# Patient Record
Sex: Male | Born: 1985 | Race: Black or African American | Hispanic: No | Marital: Single | Smoking: Former smoker
Health system: Southern US, Community
[De-identification: ages and names within clinical notes are randomized; demographics above are authoritative.]

---

## 2015-05-19 ENCOUNTER — Emergency Department (HOSPITAL_COMMUNITY)
Admission: EM | Admit: 2015-05-19 | Discharge: 2015-05-20 | Disposition: A | Discharge status: Home / Self Care | Payer: BLUE CROSS/BLUE SHIELD | Attending: Emergency Medicine | Admitting: Emergency Medicine

## 2015-05-19 ENCOUNTER — Encounter (HOSPITAL_COMMUNITY): Payer: Self-pay | Admitting: Oncology

## 2015-05-19 ENCOUNTER — Emergency Department (HOSPITAL_COMMUNITY): Payer: BLUE CROSS/BLUE SHIELD

## 2015-05-19 DIAGNOSIS — Z720 Tobacco use: Secondary | ICD-10-CM | POA: Diagnosis not present

## 2015-05-19 DIAGNOSIS — N4889 Other specified disorders of penis: Secondary | ICD-10-CM | POA: Diagnosis present

## 2015-05-19 DIAGNOSIS — N5082 Scrotal pain: Secondary | ICD-10-CM

## 2015-05-19 DIAGNOSIS — I861 Scrotal varices: Secondary | ICD-10-CM | POA: Insufficient documentation

## 2015-05-19 LAB — URINALYSIS, ROUTINE W REFLEX MICROSCOPIC
Bilirubin Urine: NEGATIVE
GLUCOSE, UA: NEGATIVE mg/dL
Hgb urine dipstick: NEGATIVE
KETONES UR: NEGATIVE mg/dL
Nitrite: NEGATIVE
PROTEIN: NEGATIVE mg/dL
Specific Gravity, Urine: 1.019 (ref 1.005–1.030)
Urobilinogen, UA: 0.2 mg/dL (ref 0.0–1.0)
pH: 5.5 (ref 5.0–8.0)

## 2015-05-19 LAB — URINE MICROSCOPIC-ADD ON

## 2015-05-19 MED ORDER — IBUPROFEN 800 MG PO TABS: 800.0000 mg | ORAL_TABLET | Freq: Three times a day (TID) | ORAL | Status: DC

## 2015-05-19 NOTE — Discharge Instructions (Signed)
Varicocele A varicocele is a swelling of veins in the scrotum (the bag of skin that contains the testicles). It is most common in young men. It occurs most often on the left side. Small or painless varicoceles do not need treatment. Most often, this is not a serious problem, but further tests may be needed to confirm the diagnosis. Surgery may be needed if complications of varicoceles arise. Rarely, varicoceles can reoccur after surgery. CAUSES  The swelling is due to blood backing up in the vein that leads from the testicle back to the body. Blood backs up because the valves inside the vein are not working properly. Veins normally return blood to the heart. Valves in veins are supposed to be one-way valves. They should not allow blood to flow backwards. If the valves do not work well, blood can pool in a vein and make it swell. The same thing happens with varicose veins in the leg. SYMPTOMS  A varicocele most often causes no symptoms. When they occur, symptoms include:   Swelling on one side of the scrotum.  Swelling that is more obvious when standing up.  A lumpy feeling in the scrotum.  Heaviness on one side of the scrotum.  Dull ache in the scrotum, especially after exercise or prolonged standing or sitting.  Slower growth or reduced size of the testicle on the side of the varicocele (in young males).  Problems with fertility can arise if the testicle does not grow normally. DIAGNOSIS  Varicocele is usually diagnosed by a physical exam. Sometimes ultrasonography is done. TREATMENT  Usually, varicoceles need no treatment. They are often routinely monitored on exam by your caregiver to ensure they do not slow the growth of the testicle on that side. Treatment may be needed if:  The varicocele is large.  There is a lot of pain.  The varicocele causes a decrease in the size of the testicle in a growing adolescent.  The other testicle is absent or not normal.  Varicoceles are found on  both sides of the scrotum.  There is pain when exercising.  There are fertility problems. There are two types of treatment:  Surgery. The surgeon ties off the swollen veins. Surgery may be done with an incision in the skin or through a laparoscope. The surgery is usually done in an outpatient setting. Outpatient means there is no overnight stay in a hospital.  Embolization. A small tube is placed in a vein and guided into the swollen veins. X-rays are used to guide the small tube. Tiny metal coils or other blocking items are put through the tube. This blocks swollen veins and the flow of blood. This is usually done in an outpatient setting without the use of general anesthesia. HOME CARE INSTRUCTIONS  To decrease discomfort:  Wear supportive underwear.  Use an athletic supporter for sports.  Only take over-the-counter or prescription medicines for pain or discomfort as directed by your caregiver. SEEK MEDICAL CARE IF:   Pain is increasing.  Swelling does not decrease when lying down.  Testicle is smaller.  The testicle becomes enlarged, swollen, red, or painful. Document Released: 11/09/2000 Document Revised: 10/26/2011 Document Reviewed: 11/13/2009 ExitCare Patient Information 2015 [employer]. This information is not intended to replace advice given to you by your health care provider. Make sure you discuss any questions you have with your health care provider.  

## 2015-05-19 NOTE — ED Notes (Signed)
Pt presents d/t penis pain and swelling.  This has happened in the past however was never seen for it.

## 2015-05-20 NOTE — ED Provider Notes (Signed)
CSN: [id number]     Arrival date & time 05/19/15  2023 History   First MD Initiated Contact with Patient 05/19/15 2129     Chief Complaint  Patient presents with  . Penis Pain     (Consider location/radiation/quality/duration/timing/severity/associated sxs/prior Treatment) HPI Comments: Patient had testicular pain and swelling when he sat down today. He did not injure it. He has felt this pain recurrently over the past 18 months. Patient has not associated this with heavy lifting. The pain has improved but is still present. He had some mild swelling behind the testicle but no actual testicular pain. No dysuria or penile discharge.  Patient is a 29 y.o. male presenting with penile pain. The history is provided by the patient.  Penis Pain This is a recurrent problem. The current episode started 3 to 5 hours ago. The problem occurs constantly. The problem has been gradually improving. Pertinent negatives include no abdominal pain and no shortness of breath. Nothing aggravates the symptoms. Nothing relieves the symptoms.    History reviewed. No pertinent past medical history. History reviewed. No pertinent past surgical history. History reviewed. No pertinent family history. Social History  Substance Use Topics  . Smoking status: Current Every Day Smoker  . Smokeless tobacco: Never Used  . Alcohol Use: Yes    Review of Systems  Constitutional: Negative for fever.  Respiratory: Negative for cough and shortness of breath.   Gastrointestinal: Negative for vomiting and abdominal pain.  Genitourinary: Positive for penile pain.  All other systems reviewed and are negative.     Allergies  Review of patient's allergies indicates no known allergies.  Home Medications   Prior to Admission medications   Medication Sig Start Date End Date Taking? Authorizing Provider  ibuprofen (ADVIL,MOTRIN) 800 MG tablet Take 1 tablet (800 mg total) by mouth 3 (three) times daily. 05/19/15   Elwin Mocha, MD   BP 121/78 mmHg  Pulse 74  Temp(Src) 98 F (36.7 C) (Oral)  Resp 18  Ht  (1.778 m)  Wt 155 lb (70.308 kg)  BMI 22.24 kg/m2  SpO2 98% Physical Exam  Constitutional: He is oriented to person, place, and time. He appears well-developed and well-nourished. No distress.  HENT:  Head: Normocephalic and atraumatic.  Mouth/Throat: No oropharyngeal exudate.  Eyes: EOM are normal. Pupils are equal, round, and reactive to light.  Neck: Normal range of motion. Neck supple.  Cardiovascular: Normal rate and regular rhythm.  Exam reveals no friction rub.   No murmur heard. Pulmonary/Chest: Effort normal and breath sounds normal. No respiratory distress. He has no wheezes. He has no rales.  Abdominal: He exhibits no distension. There is no tenderness. There is no rebound.  Genitourinary: Right testis shows swelling (mild epididymal swelling). Right testis shows no mass and no tenderness. Right testis is descended. Left testis shows no mass, no swelling and no tenderness. Left testis is descended. No phimosis, hypospadias or penile tenderness.  Musculoskeletal: Normal range of motion. He exhibits no edema.  Neurological: He is alert and oriented to person, place, and time.  Skin: He is not diaphoretic.  Nursing note and vitals reviewed.   ED Course  Procedures (including critical care time) Labs Review Labs Reviewed  URINALYSIS, ROUTINE W REFLEX MICROSCOPIC (NOT AT [facility]) - Abnormal; Notable for the following:    APPearance CLOUDY (*)    Leukocytes, UA SMALL (*)    All other components within normal limits  URINE MICROSCOPIC-ADD ON    Imaging Review US Scrotum  05/19/2015   CLINICAL DATA:  Right epididymal pain.  Possible hernia.  EXAM: SCROTAL ULTRASOUND  DOPPLER ULTRASOUND OF THE TESTICLES  TECHNIQUE: Complete ultrasound examination of the testicles, epididymis, and other scrotal structures was performed. Color and spectral Doppler ultrasound were also utilized to evaluate  blood flow to the testicles.  COMPARISON:  None.  FINDINGS: Right testicle  Measurements: 4.4 x 1.7 x 2.3 cm. No mass or microlithiasis visualized.  Left testicle  Measurements: 4.4 x 1.6 x 2.4 cm. No mass or microlithiasis visualized.  Right epididymis:  Normal in size and appearance.  Left epididymis:  Normal in size and appearance.  Hydrocele:  None visualized.  Varicocele:  Small varicoceles bilaterally.  Pulsed Doppler interrogation of both testes demonstrates normal low resistance arterial and venous waveforms bilaterally.  IMPRESSION: Normal testis and epididymis bilaterally. No torsion. Small bilateral varicoceles.   Electronically Signed   By: Ellery Plunk M.D.   On: 05/19/2015 23:44   [place] Art/ven Flow Abd Pelv Doppler  05/19/2015   CLINICAL DATA:  Right epididymal pain.  Possible hernia.  EXAM: SCROTAL ULTRASOUND  DOPPLER ULTRASOUND OF THE TESTICLES  TECHNIQUE: Complete ultrasound examination of the testicles, epididymis, and other scrotal structures was performed. Color and spectral Doppler ultrasound were also utilized to evaluate blood flow to the testicles.  COMPARISON:  None.  FINDINGS: Right testicle  Measurements: 4.4 x 1.7 x 2.3 cm. No mass or microlithiasis visualized.  Left testicle  Measurements: 4.4 x 1.6 x 2.4 cm. No mass or microlithiasis visualized.  Right epididymis:  Normal in size and appearance.  Left epididymis:  Normal in size and appearance.  Hydrocele:  None visualized.  Varicocele:  Small varicoceles bilaterally.  Pulsed Doppler interrogation of both testes demonstrates normal low resistance arterial and venous waveforms bilaterally.  IMPRESSION: Normal testis and epididymis bilaterally. No torsion. Small bilateral varicoceles.   Electronically Signed   By: Ellery Plunk M.D.   On: 05/19/2015 23:44   I have personally reviewed and evaluated these images and lab results as part of my medical decision-making.   EKG Interpretation None      MDM   Final diagnoses:   Bilateral varicoceles    28 rolled male here with some mild scrotal pain and swelling in the epididymal tissues. No discharge. He states recent negative testing for gonorrhea and chlamydia. Here vitals are stable. Testicles are nontender. He does have some mild swelling in the epididymal tissue spine the right testicle with mild tenderness. No hernias appreciated ultrasound shows small bilateral varicoceles. Instructed to do supportive underwear, NSAIDs, and urology follow-up    Elwin Mocha, MD 05/20/15 0006

## 2015-11-04 ENCOUNTER — Emergency Department (HOSPITAL_COMMUNITY)
Admission: EM | Admit: 2015-11-04 | Discharge: 2015-11-04 | Disposition: A | Discharge status: Home / Self Care | Payer: BLUE CROSS/BLUE SHIELD | Attending: Emergency Medicine | Admitting: Emergency Medicine

## 2015-11-04 ENCOUNTER — Encounter (HOSPITAL_COMMUNITY): Payer: Self-pay | Admitting: Emergency Medicine

## 2015-11-04 DIAGNOSIS — R109 Unspecified abdominal pain: Secondary | ICD-10-CM | POA: Diagnosis present

## 2015-11-04 DIAGNOSIS — R112 Nausea with vomiting, unspecified: Secondary | ICD-10-CM | POA: Insufficient documentation

## 2015-11-04 DIAGNOSIS — R197 Diarrhea, unspecified: Secondary | ICD-10-CM | POA: Diagnosis not present

## 2015-11-04 DIAGNOSIS — F17200 Nicotine dependence, unspecified, uncomplicated: Secondary | ICD-10-CM | POA: Insufficient documentation

## 2015-11-04 LAB — URINALYSIS, ROUTINE W REFLEX MICROSCOPIC
Bilirubin Urine: NEGATIVE
GLUCOSE, UA: NEGATIVE mg/dL
HGB URINE DIPSTICK: NEGATIVE
KETONES UR: NEGATIVE mg/dL
Nitrite: NEGATIVE
Protein, ur: NEGATIVE mg/dL
Specific Gravity, Urine: 1.017 (ref 1.005–1.030)
pH: 5.5 (ref 5.0–8.0)

## 2015-11-04 LAB — COMPREHENSIVE METABOLIC PANEL
ALBUMIN: 4.7 g/dL (ref 3.5–5.0)
ALT: 15 U/L — AB (ref 17–63)
AST: 19 U/L (ref 15–41)
Alkaline Phosphatase: 62 U/L (ref 38–126)
Anion gap: 8 (ref 5–15)
BUN: 18 mg/dL (ref 6–20)
CHLORIDE: 102 mmol/L (ref 101–111)
CO2: 30 mmol/L (ref 22–32)
CREATININE: 1 mg/dL (ref 0.61–1.24)
Calcium: 9.3 mg/dL (ref 8.9–10.3)
GFR calc Af Amer: >60 mL/min (ref >60)
GFR calc non Af Amer: >60 mL/min (ref >60)
GLUCOSE: 89 mg/dL (ref 65–99)
POTASSIUM: 4.1 mmol/L (ref 3.5–5.1)
SODIUM: 140 mmol/L (ref 135–145)
Total Bilirubin: 0.6 mg/dL (ref 0.3–1.2)
Total Protein: 8.2 g/dL — ABNORMAL HIGH (ref 6.5–8.1)

## 2015-11-04 LAB — URINE MICROSCOPIC-ADD ON
RBC / HPF: NONE SEEN RBC/hpf (ref 0–5)
Squamous Epithelial / LPF: NONE SEEN

## 2015-11-04 LAB — CBC
HEMATOCRIT: 43.4 % (ref 39.0–52.0)
Hemoglobin: 14.6 g/dL (ref 13.0–17.0)
MCH: 29.4 pg (ref 26.0–34.0)
MCHC: 33.6 g/dL (ref 30.0–36.0)
MCV: 87.3 fL (ref 78.0–100.0)
Platelets: 171 10*3/uL (ref 150–400)
RBC: 4.97 MIL/uL (ref 4.22–5.81)
RDW: 11.9 % (ref 11.5–15.5)
WBC: 7.7 10*3/uL (ref 4.0–10.5)

## 2015-11-04 LAB — LIPASE, BLOOD: LIPASE: 41 U/L (ref 11–51)

## 2015-11-04 MED ORDER — OMEPRAZOLE 20 MG PO CPDR: 20.0000 mg | DELAYED_RELEASE_CAPSULE | Freq: Every day | ORAL | Status: DC

## 2015-11-04 NOTE — ED Provider Notes (Signed)
CSN: [id number]     Arrival date & time 11/04/15  1613 History   First MD Initiated Contact with Patient 11/04/15 2113     Chief Complaint  Patient presents with  . Abdominal Pain     (Consider location/radiation/quality/duration/timing/severity/associated sxs/prior Treatment) HPI Marc Moreno is a 30 y.o. male who comes in for evaluation of intermittent abdominal pain with associated nausea, vomiting and diarrhea since February 2016. Patient reports he notices an association when he drinks alcohol. Reports that he drinks heavily, oftentimes drinking bottles of wine daily. Denies any fevers, chills, coffee ground emesis, dark or bloody stool, abdominal distention, urinary symptoms, back pain. Has not tried anything to improve his symptoms. Nothing seems to make problem better. Reports that he has slowed his alcohol consumption significantly over the past 3 months.  History reviewed. No pertinent past medical history. History reviewed. No pertinent past surgical history. No family history on file. Social History  Substance Use Topics  . Smoking status: Current Every Day Smoker  . Smokeless tobacco: Never Used  . Alcohol Use: Yes    Review of Systems A 10 point review of systems was completed and was negative except for pertinent positives and negatives as mentioned in the history of present illness     Allergies  Review of patient's allergies indicates no known allergies.  Home Medications   Prior to Admission medications   Medication Sig Start Date End Date Taking? Authorizing Provider  omeprazole (PRILOSEC) 20 MG capsule Take 1 capsule (20 mg total) by mouth daily. 11/04/15   Annice Jolly, PA-C   BP 120/78 mmHg  Pulse 69  Temp(Src) 98.4 F (36.9 C) (Oral)  Resp 20  SpO2 100% Physical Exam  Constitutional: He is oriented to person, place, and time. He appears well-developed and well-nourished.  HENT:  Head: Normocephalic and atraumatic.  Mouth/Throat: Oropharynx is  clear and moist.  Eyes: Conjunctivae are normal. Pupils are equal, round, and reactive to light. Right eye exhibits no discharge. Left eye exhibits no discharge. No scleral icterus.  Neck: Neck supple.  Cardiovascular: Normal rate, regular rhythm and normal heart sounds.   Pulmonary/Chest: Effort normal and breath sounds normal. No respiratory distress. He has no wheezes. He has no rales.  Abdominal: Soft. There is no tenderness.  Musculoskeletal: He exhibits no tenderness.  Neurological: He is alert and oriented to person, place, and time.  Cranial Nerves II-XII grossly intact  Skin: Skin is warm and dry. No rash noted.  Psychiatric: He has a normal mood and affect.  Nursing note and vitals reviewed.   ED Course  Procedures (including critical care time) Labs Review Labs Reviewed  COMPREHENSIVE METABOLIC PANEL - Abnormal; Notable for the following:    Total Protein 8.2 (*)    ALT 15 (*)    All other components within normal limits  URINALYSIS, ROUTINE W REFLEX MICROSCOPIC (NOT AT [facility]) - Abnormal; Notable for the following:    Leukocytes, UA SMALL (*)    All other components within normal limits  URINE MICROSCOPIC-ADD ON - Abnormal; Notable for the following:    Bacteria, UA RARE (*)    All other components within normal limits  LIPASE, BLOOD  CBC    Imaging Review No results found. I have personally reviewed and evaluated these images and lab results as part of my medical decision-making.   EKG Interpretation None     Meds given in ED:  Medications - No data to display  There are no discharge medications for this patient.  Filed Vitals:   11/04/15 1642 11/04/15 2109  BP: 139/80 120/78  Pulse: 70 69  Temp: 98.4 F (36.9 C)   TempSrc: Oral   Resp: 16 20  SpO2: 98% 100%    MDM  [name] is a 30 y.o. male who presents for intermittent abdominal discomfort with nausea, vomiting and diarrhea over the past one year that he relates to alcohol use. Symptoms  improved with discontinuance of alcohol. Discussed alcohol cessation, patient amenable. Physical exam is grossly unremarkable, Benign abdominal exam, vital signs are stable, is afebrile. Labs are unremarkable. Urine without infection. Plan to DC with PPI and follow up with PCP. He verbalizes understanding, agrees with this plan as well as subsequent discharge. Final diagnoses:  Abdominal discomfort     Joycie PeekBenjamin Kennedie Pardoe, PA-C 11/05/15 0015  Raeford RazorStephen Kohut, MD 11/08/15 [phone]

## 2015-11-04 NOTE — ED Notes (Signed)
Pt c/o abdominal pain, N/V/D intermittently since February 2016.  Pt sts that over the past 5 months it has gotten worse because he has been drinking heavily. Pt sts when he wipes after a bowel movement he is wiping clear mucus. Pt A&Ox4 and ambulatory.

## 2015-11-04 NOTE — ED Notes (Signed)
PA at bedside.

## 2015-11-04 NOTE — Discharge Instructions (Signed)
There does not appear to be an emergent cause for your symptoms at this time. Your exam, labs were reassuring. Please take your medications as prescribed. It is also important to stop drinking alcohol as this can exacerbate or prolonged your symptoms. Follow-up with your doctor for reevaluation in the next 2-3 days. Return to ED for any new or worsening symptoms.

## 2016-04-30 ENCOUNTER — Encounter (HOSPITAL_COMMUNITY): Payer: Self-pay | Admitting: *Deleted

## 2016-04-30 ENCOUNTER — Emergency Department (HOSPITAL_COMMUNITY)
Admission: EM | Admit: 2016-04-30 | Discharge: 2016-05-01 | Disposition: A | Discharge status: Home / Self Care | Payer: BLUE CROSS/BLUE SHIELD | Attending: Physician Assistant | Admitting: Physician Assistant

## 2016-04-30 DIAGNOSIS — Z87891 Personal history of nicotine dependence: Secondary | ICD-10-CM | POA: Diagnosis not present

## 2016-04-30 DIAGNOSIS — N390 Urinary tract infection, site not specified: Secondary | ICD-10-CM | POA: Insufficient documentation

## 2016-04-30 DIAGNOSIS — R109 Unspecified abdominal pain: Secondary | ICD-10-CM | POA: Diagnosis present

## 2016-04-30 LAB — URINALYSIS, ROUTINE W REFLEX MICROSCOPIC
BILIRUBIN URINE: NEGATIVE
Glucose, UA: NEGATIVE mg/dL
KETONES UR: NEGATIVE mg/dL
Nitrite: POSITIVE — AB
PROTEIN: NEGATIVE mg/dL
Specific Gravity, Urine: 1.012 (ref 1.005–1.030)
pH: 6.5 (ref 5.0–8.0)

## 2016-04-30 LAB — CBC
HCT: 43.2 % (ref 39.0–52.0)
Hemoglobin: 15 g/dL (ref 13.0–17.0)
MCH: 29.9 pg (ref 26.0–34.0)
MCHC: 34.7 g/dL (ref 30.0–36.0)
MCV: 86.2 fL (ref 78.0–100.0)
PLATELETS: 166 10*3/uL (ref 150–400)
RBC: 5.01 MIL/uL (ref 4.22–5.81)
RDW: 12.2 % (ref 11.5–15.5)
WBC: 8.1 10*3/uL (ref 4.0–10.5)

## 2016-04-30 LAB — URINE MICROSCOPIC-ADD ON

## 2016-04-30 LAB — COMPREHENSIVE METABOLIC PANEL
ALK PHOS: 52 U/L (ref 38–126)
ALT: 22 U/L (ref 17–63)
AST: 27 U/L (ref 15–41)
Albumin: 4.4 g/dL (ref 3.5–5.0)
Anion gap: 8 (ref 5–15)
BUN: 16 mg/dL (ref 6–20)
CALCIUM: 9.5 mg/dL (ref 8.9–10.3)
CO2: 26 mmol/L (ref 22–32)
CREATININE: 0.97 mg/dL (ref 0.61–1.24)
Chloride: 103 mmol/L (ref 101–111)
GFR calc non Af Amer: >60 mL/min (ref >60)
Glucose, Bld: 95 mg/dL (ref 65–99)
Potassium: 3.8 mmol/L (ref 3.5–5.1)
SODIUM: 137 mmol/L (ref 135–145)
Total Bilirubin: 0.8 mg/dL (ref 0.3–1.2)
Total Protein: 7.6 g/dL (ref 6.5–8.1)

## 2016-04-30 LAB — LIPASE, BLOOD: Lipase: 97 U/L — ABNORMAL HIGH (ref 11–51)

## 2016-04-30 MED ORDER — OMEPRAZOLE 20 MG PO CPDR: 20.0000 mg | DELAYED_RELEASE_CAPSULE | Freq: Every day | ORAL | 0 refills | Status: DC

## 2016-04-30 MED ORDER — CIPROFLOXACIN HCL 500 MG PO TABS: 500.0000 mg | ORAL_TABLET | Freq: Two times a day (BID) | ORAL | 0 refills | Status: DC

## 2016-04-30 NOTE — ED Triage Notes (Signed)
Pt states stomach pain for months now and has been on omeprazole. Pt is here again with horrible stomach pains like it is in knots. Use to drink etoh badly and has stopped now and states he drank on 2 separate occasions

## 2016-04-30 NOTE — Discharge Instructions (Signed)
You had a mild elevation in your lipase. You should  stop drinking. We will want you to take omeprazole to help with your abdominal pain. It helped previously. Please follow-up with your primary care physician and yourGI physician. We also treated for urinary tract infection today.  Please return do not feel improved or you have any other symptoms or concerns.  To find a primary care or specialty doctor please call [phone] or [phone] to access "[place] Find a Doctor Service."  You may also go on the [facility] website at [email or web address]  There are also multiple [facility] practices throughout the [place] that are frequently accepting new patients. You may find a clinic that is close to your home and contact them.  [facility] -  [address] [place] [id number] [phone]  [facility] in [place] (also locations in [place] and [place]) -  [address] [place] [address] [phone]  [facility] -  [address] [place] [address] [phone]

## 2016-04-30 NOTE — ED Provider Notes (Addendum)
[facility]-EMERGENCY DEPT Provider Note   CSN: [id number] Arrival date & time: 04/30/16  1758     History   Chief Complaint Chief Complaint  Patient presents with  . Abdominal Pain    HPI Marc Moreno is a 30 y.o. male.  HPI   Patient is a 30 year old healthy-appearing male presenting with abdominal pain. Patient reports that he has had abdominal pain for last couple years. He was seen by a GI physician and given omeprazole. This helped his symptoms. However he stopped taking it. The symptoms come back. Patient also drinks alcohol. He notes that the abdominal pain gets worse when he drinks a lot of alcohol.  Patient is here with his girlfriend who has a urinary tract infection and urinary symptoms. Patient denies urinary tract symptoms at this time.  History reviewed. No pertinent past medical history.  There are no active problems to display for this patient.   History reviewed. No pertinent surgical history.     Home Medications    Prior to Admission medications   Medication Sig Start Date End Date Taking? Authorizing Provider  omeprazole (PRILOSEC) 20 MG capsule Take 1 capsule (20 mg total) by mouth daily. 11/04/15  Yes Joycie Peek, PA-C    Family History No family history on file.  Social History Social History  Substance Use Topics  . Smoking status: Former Games developer  . Smokeless tobacco: Never Used  . Alcohol use Yes     Allergies   Review of patient's allergies indicates no known allergies.   Review of Systems Review of Systems  Constitutional: Negative for activity change and fever.  Respiratory: Negative for shortness of breath.   Cardiovascular: Negative for chest pain.  Gastrointestinal: Positive for abdominal pain. Negative for diarrhea, nausea and vomiting.  Genitourinary: Negative for difficulty urinating, discharge and dysuria.  Neurological: Negative for dizziness.     Physical Exam Updated Vital Signs BP 125/77 (BP Location: Right  Arm)   Pulse 77   Temp 98 F (36.7 C) (Oral)   Resp 10   SpO2 100%   Physical Exam  Constitutional: He is oriented to person, place, and time. He appears well-nourished.  HENT:  Head: Normocephalic.  Eyes: Conjunctivae are normal.  Cardiovascular: Normal rate and regular rhythm.   No murmur heard. Pulmonary/Chest: Effort normal. No respiratory distress.  Abdominal: He exhibits no distension.  Mild epigastric tenderness  Neurological: He is oriented to person, place, and time.  Skin: Skin is warm and dry. He is not diaphoretic.  Psychiatric: He has a normal mood and affect. His behavior is normal.     ED Treatments / Results  Labs (all labs ordered are listed, but only abnormal results are displayed) Labs Reviewed  LIPASE, BLOOD - Abnormal; Notable for the following:       Result Value   Lipase 97 (*)    All other components within normal limits  URINALYSIS, ROUTINE W REFLEX MICROSCOPIC (NOT AT [facility]) - Abnormal; Notable for the following:    Color, Urine ORANGE (*)    APPearance TURBID (*)    Hgb urine dipstick LARGE (*)    Nitrite POSITIVE (*)    Leukocytes, UA LARGE (*)    All other components within normal limits  URINE MICROSCOPIC-ADD ON - Abnormal; Notable for the following:    Squamous Epithelial / LPF 6-30 (*)    Bacteria, UA MANY (*)    All other components within normal limits  COMPREHENSIVE METABOLIC PANEL  CBC    EKG  EKG  Interpretation None       Radiology No results found.  Procedures Procedures (including critical care time)  Medications Ordered in ED Medications - No data to display   Initial Impression / Assessment and Plan / ED Course  I have reviewed the triage vital signs and the nursing notes.  Pertinent labs & imaging results that were available during my care of the patient were reviewed by me and considered in my medical decision making (see chart for details).  Clinical Course    Patient's age 30 year old male, heavy  drinker presenting with abdominal pain for the last year. Patient reports he took omeprazole which made the pain better. He stopped taking omeprazole ebcuase he ran out. He has mild elevation in his lipase today. This is likely due from drinking. Told him to stop drinking. Patient's has no tenderness right upper quadrant, do not suspect any gallbladder pathology. Patient's vital signs are normal. Patient is eating and drinking normally. No vomiting. Patient's lab work normal except for the mild elevation in lipase. Patient also found have a urinary tract infection. Of note patient's girlfriend also has urinary tract infection on labs with recent treatment.  We'll tell patient to stop drinking, we'll give treat UTI. We'll also give prescription for omeprazole.  Patient's girlfriend had STDs panel sent, they will call her with follow-up. Patient aware about the follow-up plan.  Final Clinical Impressions(s) / ED Diagnoses   Final diagnoses:  None    New Prescriptions New Prescriptions   No medications on file     Arlo Buffone Randall AnLyn Mardene Lessig, MD 04/30/16 2336    Petros Ahart Lyn Elisea Khader, MD 04/30/16 2336

## 2016-05-11 ENCOUNTER — Ambulatory Visit
Admission: RE | Admit: 2016-05-11 | Discharge: 2016-05-11 | Disposition: A | Discharge status: Home / Self Care | Payer: BLUE CROSS/BLUE SHIELD | Source: Ambulatory Visit | Attending: Family Medicine | Admitting: Family Medicine

## 2016-05-11 ENCOUNTER — Other Ambulatory Visit: Payer: Self-pay | Admitting: Family Medicine

## 2016-05-11 DIAGNOSIS — K279 Peptic ulcer, site unspecified, unspecified as acute or chronic, without hemorrhage or perforation: Secondary | ICD-10-CM

## 2016-05-11 DIAGNOSIS — R109 Unspecified abdominal pain: Secondary | ICD-10-CM

## 2016-05-11 DIAGNOSIS — R140 Abdominal distension (gaseous): Secondary | ICD-10-CM

## 2016-08-16 ENCOUNTER — Emergency Department (HOSPITAL_COMMUNITY)
Admission: EM | Admit: 2016-08-16 | Discharge: 2016-08-16 | Disposition: A | Discharge status: Home / Self Care | Payer: BLUE CROSS/BLUE SHIELD | Attending: Emergency Medicine | Admitting: Emergency Medicine

## 2016-08-16 ENCOUNTER — Encounter (HOSPITAL_COMMUNITY): Payer: Self-pay | Admitting: Emergency Medicine

## 2016-08-16 DIAGNOSIS — Z79899 Other long term (current) drug therapy: Secondary | ICD-10-CM | POA: Insufficient documentation

## 2016-08-16 DIAGNOSIS — Z87891 Personal history of nicotine dependence: Secondary | ICD-10-CM | POA: Diagnosis not present

## 2016-08-16 DIAGNOSIS — R1030 Lower abdominal pain, unspecified: Secondary | ICD-10-CM | POA: Insufficient documentation

## 2016-08-16 DIAGNOSIS — R102 Pelvic and perineal pain: Secondary | ICD-10-CM

## 2016-08-16 LAB — URINALYSIS, ROUTINE W REFLEX MICROSCOPIC
BILIRUBIN URINE: NEGATIVE
GLUCOSE, UA: NEGATIVE mg/dL
HGB URINE DIPSTICK: NEGATIVE
KETONES UR: NEGATIVE mg/dL
Leukocytes, UA: NEGATIVE
Nitrite: NEGATIVE
PH: 5 (ref 5.0–8.0)
Protein, ur: NEGATIVE mg/dL
SPECIFIC GRAVITY, URINE: 1.013 (ref 1.005–1.030)

## 2016-08-16 LAB — CBC
HCT: 41.7 % (ref 39.0–52.0)
HEMOGLOBIN: 14.5 g/dL (ref 13.0–17.0)
MCH: 29.7 pg (ref 26.0–34.0)
MCHC: 34.8 g/dL (ref 30.0–36.0)
MCV: 85.3 fL (ref 78.0–100.0)
PLATELETS: 149 10*3/uL — AB (ref 150–400)
RBC: 4.89 MIL/uL (ref 4.22–5.81)
RDW: 12.7 % (ref 11.5–15.5)
WBC: 5.9 10*3/uL (ref 4.0–10.5)

## 2016-08-16 LAB — COMPREHENSIVE METABOLIC PANEL
ALK PHOS: 61 U/L (ref 38–126)
ALT: 16 U/L — AB (ref 17–63)
ANION GAP: 7 (ref 5–15)
AST: 22 U/L (ref 15–41)
Albumin: 4.4 g/dL (ref 3.5–5.0)
BILIRUBIN TOTAL: 0.5 mg/dL (ref 0.3–1.2)
BUN: 15 mg/dL (ref 6–20)
CALCIUM: 9.2 mg/dL (ref 8.9–10.3)
CO2: 29 mmol/L (ref 22–32)
CREATININE: 0.69 mg/dL (ref 0.61–1.24)
Chloride: 103 mmol/L (ref 101–111)
GFR calc non Af Amer: >60 mL/min (ref >60)
Glucose, Bld: 82 mg/dL (ref 65–99)
Potassium: 4.2 mmol/L (ref 3.5–5.1)
Sodium: 139 mmol/L (ref 135–145)
TOTAL PROTEIN: 7.8 g/dL (ref 6.5–8.1)

## 2016-08-16 LAB — LIPASE, BLOOD: Lipase: 26 U/L (ref 11–51)

## 2016-08-16 NOTE — ED Triage Notes (Signed)
Pt c/o ride side. Pt states his girlfriend has similar symptoms. Both patient and his girlfriend have had recurrent UTIs, girlfirend had BV, which they have been treated for but symptoms have returned. Pt reports last time he had UTI he had no symptoms. No nausea, vomiting, diarrhea.

## 2016-08-16 NOTE — ED Notes (Signed)
Patient was alert, oriented and stable upon discharge. RN went over AVS and patient had no further questions.  

## 2016-08-16 NOTE — Discharge Instructions (Signed)
Your labs and urine tests today were normal. We have tested you for gonorrhea and chlamydia.  We will call you in 24-48 hours If culture results are positive. Follow-up with your primary care doctor. Return here for new concerns.

## 2016-08-16 NOTE — ED Provider Notes (Signed)
[facility] Provider Note   CSN: [id number] Arrival date & time: 08/16/16  1458     History   Chief Complaint Chief Complaint  Patient presents with  . Abdominal Pain    HPI Marc Moreno is a 30 y.o. male.  The history is provided by the patient and medical records.   30 year old male with hx of UTI here with suprapubic pressure.  States a few months ago he was seen here and treated for UTI along with his girlfriend.  They are both here again today.  Patient denies any urinary symptoms, hematuria, flank pain, or fever.  No penile discharge.  Denies hx of STD, states he has been tested numerous times in the past for this.  No nausea, vomiting, or diarrhea.  No prior abdominal surgeries.  Denies testicle pain.  No intervention tried PTA.  History reviewed. No pertinent past medical history.  There are no active problems to display for this patient.   History reviewed. No pertinent surgical history.     Home Medications    Prior to Admission medications   Medication Sig Start Date End Date Taking? Authorizing Provider  omeprazole (PRILOSEC) 40 MG capsule Take 40 mg by mouth 2 (two) times daily. 06/22/16  Yes Historical Provider, MD  ciprofloxacin (CIPRO) 500 MG tablet Take 1 tablet (500 mg total) by mouth 2 (two) times daily. Patient not taking: Reported on 08/16/2016 04/30/16   Courteney Lyn Mackuen, MD  omeprazole (PRILOSEC) 20 MG capsule Take 1 capsule (20 mg total) by mouth daily. Patient not taking: Reported on 08/16/2016 11/04/15   Joycie PeekBenjamin Cartner, PA-C  omeprazole (PRILOSEC) 20 MG capsule Take 1 capsule (20 mg total) by mouth daily. Patient not taking: Reported on 08/16/2016 04/30/16   Courteney Lyn Mackuen, MD    Family History History reviewed. No pertinent family history.  Social History Social History  Substance Use Topics  . Smoking status: Former Games developermoker  . Smokeless tobacco: Never Used  . Alcohol use Yes     Allergies   Patient has no known  allergies.   Review of Systems Review of Systems  Gastrointestinal: Positive for abdominal pain (pressure).  All other systems reviewed and are negative.    Physical Exam Updated Vital Signs BP 119/73   Pulse 69   Temp 97.7 F (36.5 C) (Oral)   Resp 18   SpO2 100%   Physical Exam  Constitutional: He is oriented to person, place, and time. He appears well-developed and well-nourished.  HENT:  Head: Normocephalic and atraumatic.  Mouth/Throat: Oropharynx is clear and moist.  Eyes: Conjunctivae and EOM are normal. Pupils are equal, round, and reactive to light.  Neck: Normal range of motion.  Cardiovascular: Normal rate, regular rhythm and normal heart sounds.   Pulmonary/Chest: Effort normal and breath sounds normal.  Abdominal: Soft. Bowel sounds are normal. There is no tenderness.  Endorses "pressure" in the suprapubic region, however no focal tenderness or peritoneal signs  Musculoskeletal: Normal range of motion.  Neurological: He is alert and oriented to person, place, and time.  Skin: Skin is warm and dry.  Psychiatric: He has a normal mood and affect.  Nursing note and vitals reviewed.    ED Treatments / Results  Labs (all labs ordered are listed, but only abnormal results are displayed) Labs Reviewed  COMPREHENSIVE METABOLIC PANEL - Abnormal; Notable for the following:       Result Value   ALT 16 (*)    All other components within normal limits  CBC -  Abnormal; Notable for the following:    Platelets 149 (*)    All other components within normal limits  URINALYSIS, ROUTINE W REFLEX MICROSCOPIC  LIPASE, BLOOD  GC/CHLAMYDIA PROBE AMP ([place]) [facility]    EKG  EKG Interpretation None       Radiology No results found.  Procedures Procedures (including critical care time)  Medications Ordered in ED Medications - No data to display   Initial Impression / Assessment and Plan / ED Course  I have reviewed the triage vital signs and the  nursing notes.  Pertinent labs & imaging results that were available during my care of the patient were reviewed by me and considered in my medical decision making (see chart for details).  Clinical Course    30 year old male here with suprapubic pressure. He has concern for UTI. He has no dysuria, hematuria, flank pain, fever, or chills. He denies any penile discharge. Abdomen is soft and benign on exam, endorses pressure in the suprapubic region without focal tenderness.  Lab work and UA here without any acute findings. Patient's girlfriend has been seen in the ED this evening for similar symptoms.  Gc/chl sent. Informed he would be contacted if culture results are positive.  Will have him follow-up with his primary care doctor for any ongoing issues.  Discussed plan with patient, he acknowledged understanding and agreed with plan of care.  Return precautions given for new or worsening symptoms.  Final Clinical Impressions(s) / ED Diagnoses   Final diagnoses:  Suprapubic pressure    New Prescriptions New Prescriptions   No medications on file     Oletha BlendLisa M Courney Garrod, PA-C 08/16/16 1910    Charlynne Panderavid Hsienta Yao, MD 08/17/16 1430

## 2016-08-18 LAB — GC/CHLAMYDIA PROBE AMP (~~LOC~~) NOT AT ARMC
Chlamydia: NEGATIVE
Neisseria Gonorrhea: NEGATIVE

## 2017-11-22 ENCOUNTER — Emergency Department (HOSPITAL_COMMUNITY)
Admission: EM | Admit: 2017-11-22 | Discharge: 2017-11-22 | Disposition: A | Discharge status: Other Acute Inpatient Hospital | Payer: BLUE CROSS/BLUE SHIELD

## 2019-10-20 ENCOUNTER — Ambulatory Visit: Payer: BLUE CROSS/BLUE SHIELD | Admitting: Podiatry

## 2019-10-27 ENCOUNTER — Other Ambulatory Visit: Payer: Self-pay | Admitting: Podiatry

## 2019-10-27 ENCOUNTER — Ambulatory Visit (INDEPENDENT_AMBULATORY_CARE_PROVIDER_SITE_OTHER): Payer: BC Managed Care – PPO

## 2019-10-27 ENCOUNTER — Ambulatory Visit: Payer: BC Managed Care – PPO | Admitting: Podiatry

## 2019-10-27 ENCOUNTER — Other Ambulatory Visit: Payer: Self-pay

## 2019-10-27 DIAGNOSIS — M216X9 Other acquired deformities of unspecified foot: Secondary | ICD-10-CM | POA: Diagnosis not present

## 2019-10-27 DIAGNOSIS — M76829 Posterior tibial tendinitis, unspecified leg: Secondary | ICD-10-CM | POA: Diagnosis not present

## 2019-10-27 DIAGNOSIS — M2141 Flat foot [pes planus] (acquired), right foot: Secondary | ICD-10-CM

## 2019-10-27 DIAGNOSIS — M79671 Pain in right foot: Secondary | ICD-10-CM

## 2019-10-27 DIAGNOSIS — M25571 Pain in right ankle and joints of right foot: Secondary | ICD-10-CM

## 2019-10-27 DIAGNOSIS — G8929 Other chronic pain: Secondary | ICD-10-CM

## 2019-10-27 MED ORDER — MELOXICAM 15 MG PO TABS: 15.0000 mg | ORAL_TABLET | Freq: Every day | ORAL | 0 refills | Status: DC

## 2019-10-27 NOTE — Progress Notes (Signed)
DG  °

## 2019-10-27 NOTE — Progress Notes (Signed)
  Subjective:  Patient ID: Marc Moreno, male    DOB: 09-06-1985,  MRN: [id number]  Chief Complaint  Patient presents with  . Foot Pain    Pt states generalized right foot and ankle pain, plantar midfoot and medial ankle. Pt states 3 month duration, no known injuries and it is painful to walk.    34 y.o. male presents with the above complaint. History confirmed with patient.   Objective:  Physical Exam: warm, good capillary refill, no trophic changes or ulcerative lesions, normal DP and PT pulses and normal sensory exam.  Right Foot: pain at PT tendon, no pain at sinus tarsi. Able to perform double heel rasie but not single heel raise right. Decrease ankle joint ROM.  No images are attached to the encounter.  Radiographs: X-ray of the right foot: pes planus no acute fractures.    Assessment:   1. Pes planus of right foot   2. PTTD (posterior tibial tendon dysfunction)   3. Chronic pain of right ankle     Plan:  Patient was evaluated and treated and all questions answered.  Sinus Tarsitis -X-rays reviewed as above -Dispensed Tri-Lock ankle brace.  Patient educated on use  -Rx Mobic -Offered injeciton, declined.  Return in about 4 weeks (around 11/24/2019).

## 2019-11-24 ENCOUNTER — Other Ambulatory Visit: Payer: Self-pay

## 2019-11-24 ENCOUNTER — Ambulatory Visit: Payer: BC Managed Care – PPO | Admitting: Podiatry

## 2019-11-24 ENCOUNTER — Ambulatory Visit (INDEPENDENT_AMBULATORY_CARE_PROVIDER_SITE_OTHER): Payer: BC Managed Care – PPO | Admitting: Podiatry

## 2019-11-24 DIAGNOSIS — M2141 Flat foot [pes planus] (acquired), right foot: Secondary | ICD-10-CM | POA: Diagnosis not present

## 2019-11-24 DIAGNOSIS — M76829 Posterior tibial tendinitis, unspecified leg: Secondary | ICD-10-CM

## 2020-02-17 NOTE — Progress Notes (Signed)
  Subjective:  Patient ID: Marc Moreno, male    DOB: Apr 20, 1986,  MRN: [id number]  Chief Complaint  Patient presents with  . Tendonitis    Pt states bought inserts and wears with brace. Pt states this gets rid of all his pain.    34 y.o. male presents with the above complaint. History confirmed with patient.   Objective:  Physical Exam: warm, good capillary refill, no trophic changes or ulcerative lesions, normal DP and PT pulses and normal sensory exam.  Right Foot: no pain at PT tendon, no pain at sinus tarsi. Able to perform double heel rasie but not single heel raise right. Decrease ankle joint ROM.   Assessment:   1. Pes planus of right foot   2. PTTD (posterior tibial tendon dysfunction)     Plan:  Patient was evaluated and treated and all questions answered.  Sinus Tarsitis -Doing very well transition out of brace into over-the-counter orthotics.  Advised that should he have further issues come back for further eval with possible benefit from custom molded orthotics.    No follow-ups on file.

## 2020-09-18 ENCOUNTER — Encounter: Payer: Self-pay | Admitting: Emergency Medicine

## 2020-09-18 ENCOUNTER — Ambulatory Visit
Admission: EM | Admit: 2020-09-18 | Discharge: 2020-09-18 | Disposition: A | Discharge status: Home / Self Care | Payer: BC Managed Care – PPO | Attending: Emergency Medicine | Admitting: Emergency Medicine

## 2020-09-18 ENCOUNTER — Other Ambulatory Visit: Payer: Self-pay

## 2020-09-18 ENCOUNTER — Ambulatory Visit (INDEPENDENT_AMBULATORY_CARE_PROVIDER_SITE_OTHER): Payer: BC Managed Care – PPO

## 2020-09-18 DIAGNOSIS — K5900 Constipation, unspecified: Secondary | ICD-10-CM | POA: Diagnosis not present

## 2020-09-18 DIAGNOSIS — R109 Unspecified abdominal pain: Secondary | ICD-10-CM | POA: Diagnosis not present

## 2020-09-18 NOTE — Discharge Instructions (Addendum)
Your x-rays today demonstrated constipation on the right side of your colon.  Take 1 capful of MiraLAX daily to help resolve constipation.  Return for reevaluation for new or worsening symptoms.

## 2020-09-18 NOTE — ED Provider Notes (Signed)
[facility]    CSN: [id number] Arrival date & time: 09/18/20  1716      History   Chief Complaint Chief Complaint  Patient presents with  . Abdominal Pain    HPI Marc Moreno is a 35 y.o. male.   HPI   35 year old male here for evaluation of abdominal pressure this been going on for the last week.  Patient describes his pressure as being on the outsides of his abdomen on the left and the right.  Patient denies fever, nausea, vomiting, painful urination, blood in his urine, or diarrhea.  Patient denies any heavy lifting.  Patient states that his last BM was this morning and was "normal-ish".  History reviewed. No pertinent past medical history.  There are no problems to display for this patient.   History reviewed. No pertinent surgical history.     Home Medications    Prior to Admission medications   Medication Sig Start Date End Date Taking? Authorizing Provider  omeprazole (PRILOSEC) 40 MG capsule Take 40 mg by mouth 2 (two) times daily. 06/22/16 09/18/20  [provider]    Family History Family History  Problem Relation Age of Onset  . Healthy Mother   . Healthy Father     Social History Social History   Tobacco Use  . Smoking status: Former Games developer  . Smokeless tobacco: Never Used  Substance Use Topics  . Alcohol use: Yes  . Drug use: No     Allergies   Patient has no known allergies.   Review of Systems Review of Systems  Constitutional: Negative for activity change, appetite change and fever.  Gastrointestinal: Positive for abdominal pain. Negative for diarrhea, nausea and vomiting.  Genitourinary: Negative for dysuria, hematuria and urgency.  Skin: Negative for rash.  Hematological: Negative.   Psychiatric/Behavioral: Negative.      Physical Exam Triage Vital Signs ED Triage Vitals  Enc Vitals Group     BP 09/18/20 1756 (!) 140/96     Pulse Rate 09/18/20 1756 76     Resp 09/18/20 1756 18     Temp 09/18/20 1756  99.2 F (37.3 C)     Temp Source 09/18/20 1756 Oral     SpO2 09/18/20 1756 100 %     Weight 09/18/20 1754 180 lb (81.6 kg)     Height 09/18/20 1754 5\' 10"  (1.778 m)     Head Circumference --      Peak Flow --      Pain Score 09/18/20 1754 0     Pain Loc --      Pain Edu? --      Excl. in GC? --    No data found.  Updated Vital Signs BP (!) 140/96 (BP Location: Right Arm)   Pulse 76   Temp 99.2 F (37.3 C) (Oral)   Resp 18   Ht 5\' 10"  (1.778 m)   Wt 180 lb (81.6 kg)   SpO2 100%   BMI 25.83 kg/m   Visual Acuity Right Eye Distance:   Left Eye Distance:   Bilateral Distance:    Right Eye Near:   Left Eye Near:    Bilateral Near:     Physical Exam Vitals and nursing note reviewed.  Constitutional:      General: He is not in acute distress.    Appearance: He is well-developed and normal weight. He is not ill-appearing.  HENT:     Head: Normocephalic and atraumatic.  Cardiovascular:     Rate  and Rhythm: Normal rate.     Heart sounds: Normal heart sounds. No murmur heard. No gallop.   Pulmonary:     Effort: Pulmonary effort is normal.     Breath sounds: Normal breath sounds. No wheezing, rhonchi or rales.  Abdominal:     General: Abdomen is flat. Bowel sounds are decreased. There is no distension.     Palpations: Abdomen is soft. There is no hepatomegaly or splenomegaly.     Tenderness: There is no abdominal tenderness. There is no right CVA tenderness or left CVA tenderness.     Hernia: No hernia is present.  Skin:    Capillary Refill: Capillary refill takes less than 2 seconds.  Neurological:     General: No focal deficit present.     Mental Status: He is alert and oriented to person, place, and time.  Psychiatric:        Mood and Affect: Mood normal.        Behavior: Behavior normal.      UC Treatments / Results  Labs (all labs ordered are listed, but only abnormal results are displayed) Labs Reviewed - No data to display  EKG   Radiology DG  Abdomen Acute W/Chest  Result Date: 09/18/2020 CLINICAL DATA:  Pain bilateral flanks EXAM: DG ABDOMEN ACUTE WITH 1 VIEW CHEST COMPARISON:  None. FINDINGS: There is no evidence of dilated bowel loops or free intraperitoneal air. No radiopaque calculi or other significant radiographic abnormality is seen. Heart size and mediastinal contours are within normal limits. Both lungs are clear. IMPRESSION: Negative abdominal radiographs.  No acute cardiopulmonary disease. Electronically Signed   By: Jonna Clark M.D.   On: 09/18/2020 19:05    Procedures Procedures (including critical care time)  Medications Ordered in UC Medications - No data to display  Initial Impression / Assessment and Plan / UC Course  I have reviewed the triage vital signs and the nursing notes.  Pertinent labs & imaging results that were available during my care of the patient were reviewed by me and considered in my medical decision making (see chart for details).   Patient is here for evaluation of pressure to his abdomen that is been going on for last week.  Patient describes the pressure as being on either side of the abdomen and not any particular location.  He has had no associated symptoms such as fever, nausea or vomiting, diarrhea, painful urination, or blood in his urine.  Patient's belly is soft, nontender, nondistended with decreased bowel sounds in all 4 quadrants.  Patient has no CVA tenderness on exam.  Patient is completely nontoxic in appearance.  Will obtain three-way abdomen.  Three-way abdomen images independently evaluated by me.  Interpretation: Patient has an increased stool burden in the ascending colon and multiple air-fluid levels through the transverse colon the descending colon appears empty.  No stool in the rectal vault.  Awaiting radiology overread.  Etiology of his reading is a negative exam.  We will treat patient for constipation with over-the-counter MiraLAX.  Final Clinical Impressions(s) / UC  Diagnoses   Final diagnoses:  Constipation, unspecified constipation type     Discharge Instructions     Your x-rays today demonstrated constipation on the right side of your colon.  Take 1 capful of MiraLAX daily to help resolve constipation.  Return for reevaluation for new or worsening symptoms.    ED Prescriptions    None     PDMP not reviewed this encounter.   Becky Augusta, NP  09/18/20 1916  

## 2020-09-18 NOTE — ED Triage Notes (Signed)
Patient c/o upper abdominal pressure that started 1 week ago. Denies any other symptoms.

## 2020-12-12 ENCOUNTER — Encounter: Payer: Self-pay | Admitting: Emergency Medicine

## 2020-12-12 ENCOUNTER — Ambulatory Visit
Admission: EM | Admit: 2020-12-12 | Discharge: 2020-12-12 | Disposition: A | Discharge status: Home / Self Care | Payer: BC Managed Care – PPO | Attending: Family Medicine | Admitting: Family Medicine

## 2020-12-12 ENCOUNTER — Other Ambulatory Visit: Payer: Self-pay

## 2020-12-12 DIAGNOSIS — R109 Unspecified abdominal pain: Secondary | ICD-10-CM | POA: Diagnosis not present

## 2020-12-12 LAB — POCT URINALYSIS DIP (MANUAL ENTRY)
Bilirubin, UA: NEGATIVE
Blood, UA: NEGATIVE
Glucose, UA: NEGATIVE mg/dL
Ketones, POC UA: NEGATIVE mg/dL
Leukocytes, UA: NEGATIVE
Nitrite, UA: NEGATIVE
Protein Ur, POC: NEGATIVE mg/dL
Spec Grav, UA: <=1.005 — AB (ref 1.010–1.025)
Urobilinogen, UA: 0.2 E.U./dL
pH, UA: 5.5 (ref 5.0–8.0)

## 2020-12-12 NOTE — Discharge Instructions (Addendum)
Schedule a follow-up at [facility] urology for evaluation of recurrent flank pressure. Your urine specimen was normal today.

## 2020-12-12 NOTE — ED Triage Notes (Signed)
Pt presents with "pressure" in sides xs 2 months. States was evaluated approx a month ago but could not find anything wrong.

## 2020-12-12 NOTE — ED Provider Notes (Signed)
[facility]    CSN: [id number] Arrival date & time: 12/12/20  1840      History   Chief Complaint Chief Complaint  Patient presents with  . Pressure in Sides    HPI Chapin Arduini is a 35 y.o. male.   HPI  Patient presents with non-specific pressure of bilateral flanks intermittently which  Relief of pressure when urinating occasionally. He drinks plenty of water. Denies that flank are painful, describes as a pressure that is not normal for him. Seen at [facility] UC diagnosed with constipation in February after experiencing similar type of symptoms. He never tried Miralax as he denies constipation. He has not attempted relief with any OTC medication to see if the pressure sensation resolves. Urination improves pain. Endorses excessively hydrating with water. Denies any burning or pain with urination. Pressure is not provoked by any activity and intermittently occurring.  History reviewed. No pertinent past medical history.  There are no problems to display for this patient.   History reviewed. No pertinent surgical history.     Home Medications    Prior to Admission medications   Medication Sig Start Date End Date Taking? Authorizing Provider  omeprazole (PRILOSEC) 40 MG capsule Take 40 mg by mouth 2 (two) times daily. 06/22/16 09/18/20  [provider]    Family History Family History  Problem Relation Age of Onset  . Healthy Mother   . Healthy Father     Social History Social History   Tobacco Use  . Smoking status: Former Games developer  . Smokeless tobacco: Never Used  Substance Use Topics  . Alcohol use: Yes  . Drug use: No     Allergies   Patient has no known allergies.   Review of Systems Review of Systems Pertinent negatives listed in HPI  Physical Exam Triage Vital Signs ED Triage Vitals  Enc Vitals Group     BP 12/12/20 1849 117/76     Pulse Rate 12/12/20 1849 82     Resp 12/12/20 1849 18     Temp 12/12/20 1849 98.2 F (36.8  C)     Temp Source 12/12/20 1849 Oral     SpO2 12/12/20 1849 96 %     Weight 12/12/20 1858 183 lb 9.6 oz (83.3 kg)     Height --      Head Circumference --      Peak Flow --      Pain Score 12/12/20 1903 0     Pain Loc --      Pain Edu? --      Excl. in GC? --    No data found.  Updated Vital Signs BP 117/76 (BP Location: Right Arm)   Pulse 82   Temp 98.2 F (36.8 C) (Oral)   Resp 18   Wt 183 lb 9.6 oz (83.3 kg)   SpO2 96%   BMI 26.34 kg/m   Visual Acuity Right Eye Distance:   Left Eye Distance:   Bilateral Distance:    Right Eye Near:   Left Eye Near:    Bilateral Near:     Physical Exam General appearance: Alert,cooperative,  no distress Head: Normocephalic, without obvious abnormality, atraumatic Respiratory: Respirations even and unlabored, normal respiratory rate Heart: rate and rhythm normal.  Abdomen: BS +, no distention, no rebound tenderness, or no mass Extremities: No gross deformities Skin: Skin color, texture, turgor normal. No rashes seen  Psych: Appropriate mood and affect. UC Treatments / Results  Labs (all labs ordered are listed,  but only abnormal results are displayed) Labs Reviewed  POCT URINALYSIS DIP (MANUAL ENTRY) - Abnormal; Notable for the following components:      Result Value   Color, UA other (*)    Spec Grav, UA <=1.005 (*)    All other components within normal limits    EKG   Radiology No results found.  Procedures Procedures (including critical care time)  Medications Ordered in UC Medications - No data to display  Initial Impression / Assessment and Plan / UC Course  I have reviewed the triage vital signs and the nursing notes.  Pertinent labs & imaging results that were available during my care of the patient were reviewed by me and considered in my medical decision making (see chart for details).    Nonspecific bilateral flank pressure, uncertain of source. No acute GI symptoms and bowel habits are normal.  Patient achieves some relief of symptoms with urination. Recommend urology evaluation to evaluate urine velocity to ensure adequate emptying of bladder or rule of other LUTS disorders. Final Clinical Impressions(s) / UC Diagnoses   Final diagnoses:  Pain, flank, bilateral     Discharge Instructions     Schedule a follow-up at [facility] for evaluation of recurrent flank pressure. Your urine specimen was normal today.    ED Prescriptions    None     PDMP not reviewed this encounter.   Bing Neighbors, FNP 12/12/20 1944

## 2022-10-13 IMAGING — CR DG ABDOMEN ACUTE W/ 1V CHEST
6 series · 6 of 6 positions shown · non-contrast
Comparison: None.

CLINICAL DATA: Pain bilateral flanks

EXAM:
DG ABDOMEN ACUTE WITH 1 VIEW CHEST

[chest pa (1 of 2)]
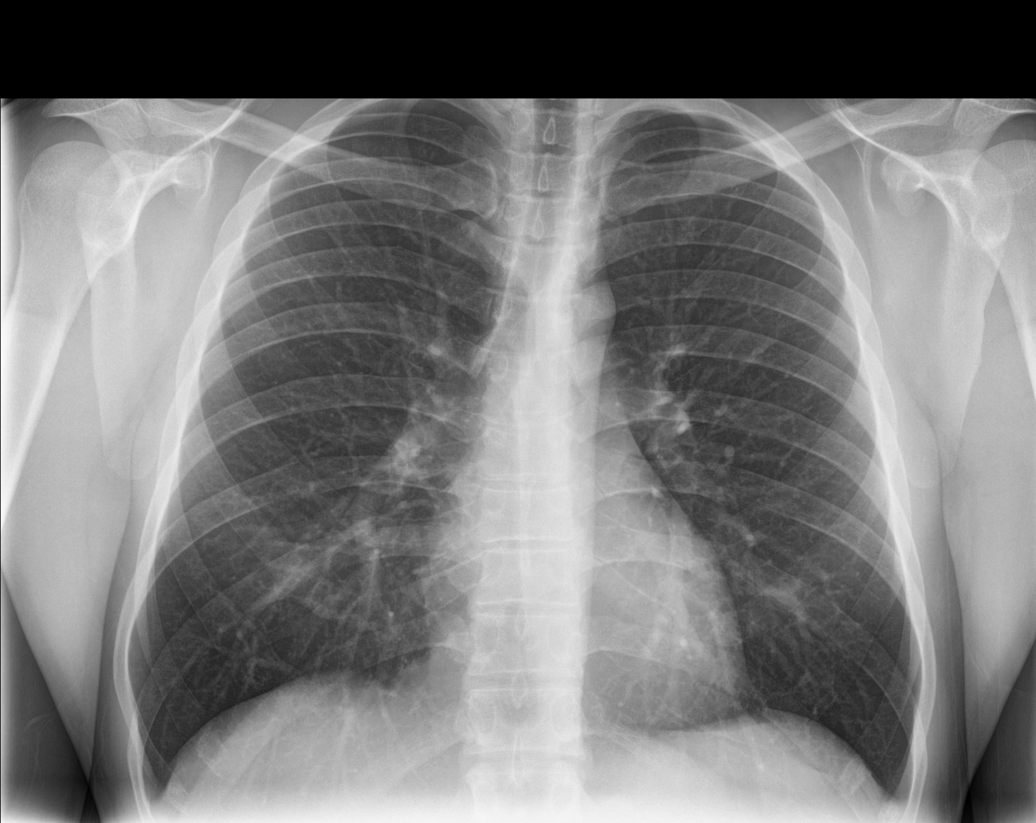

[abdomen erect (1 of 2)]
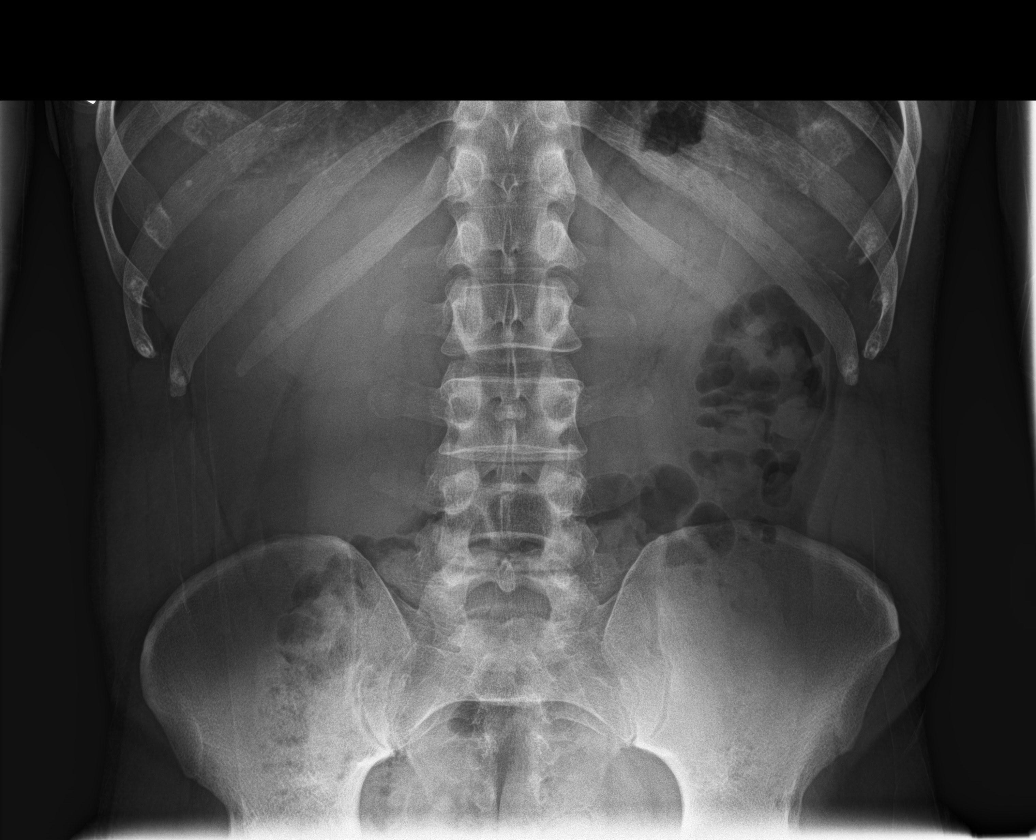

[abdomen supine (1 of 2)]
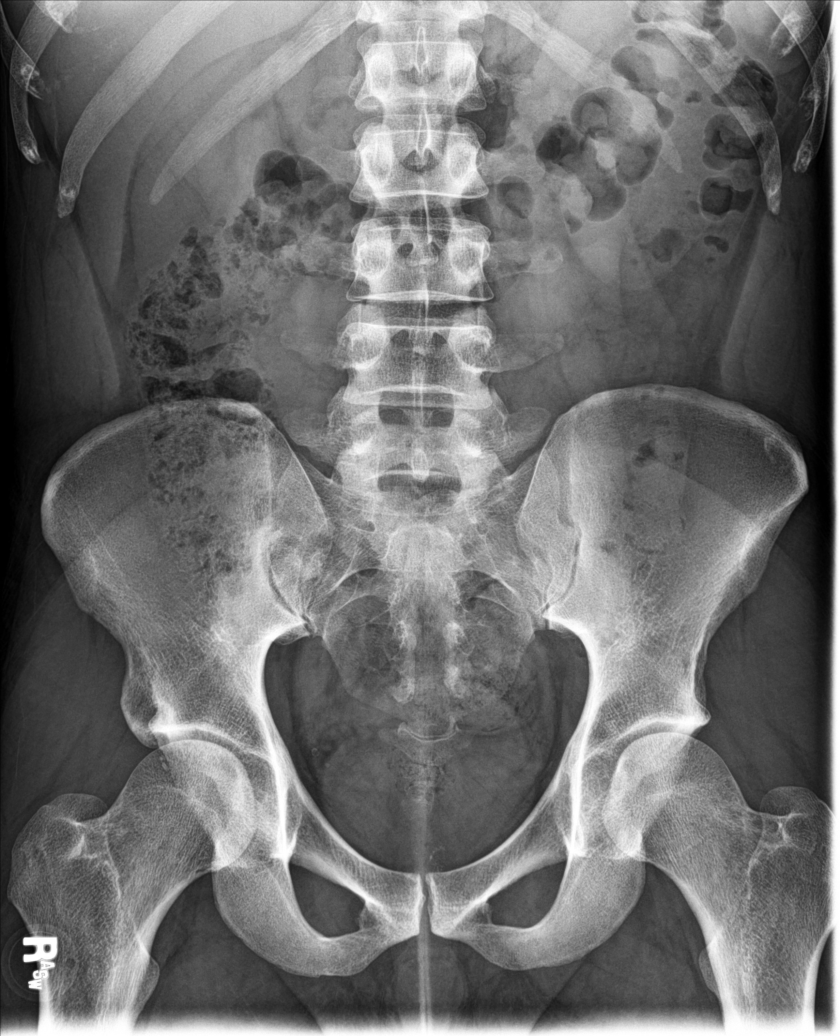

[abdomen supine (2 of 2)]
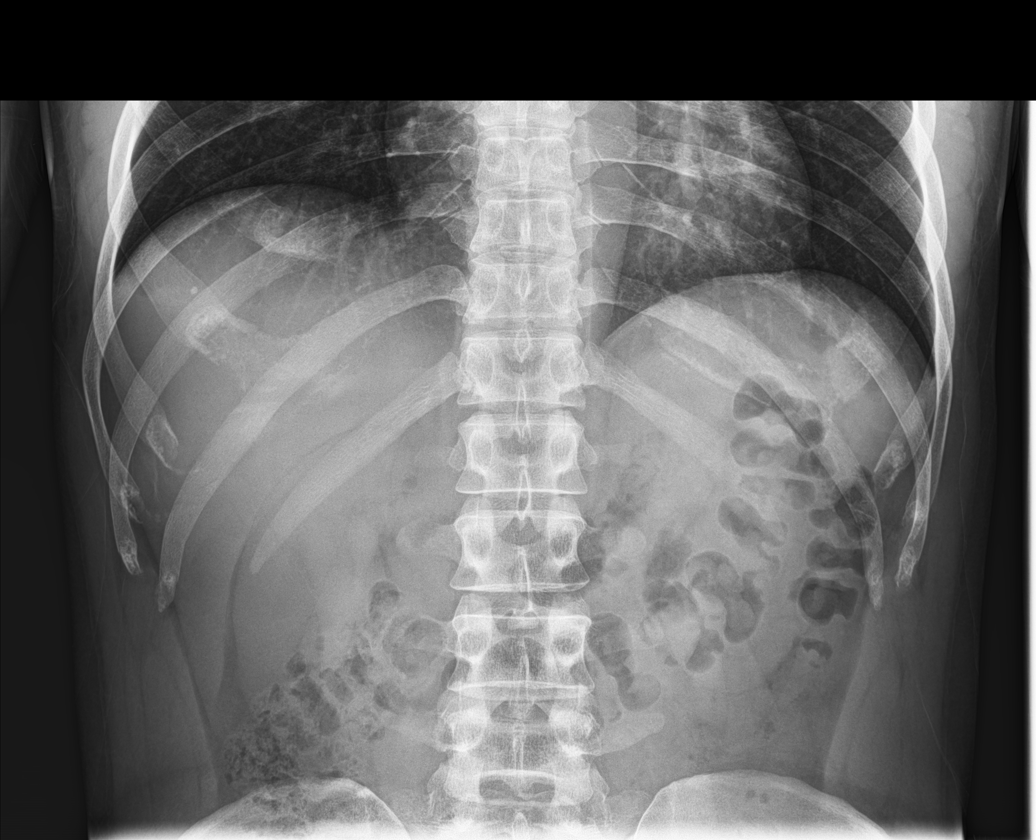

[chest pa (2 of 2)]
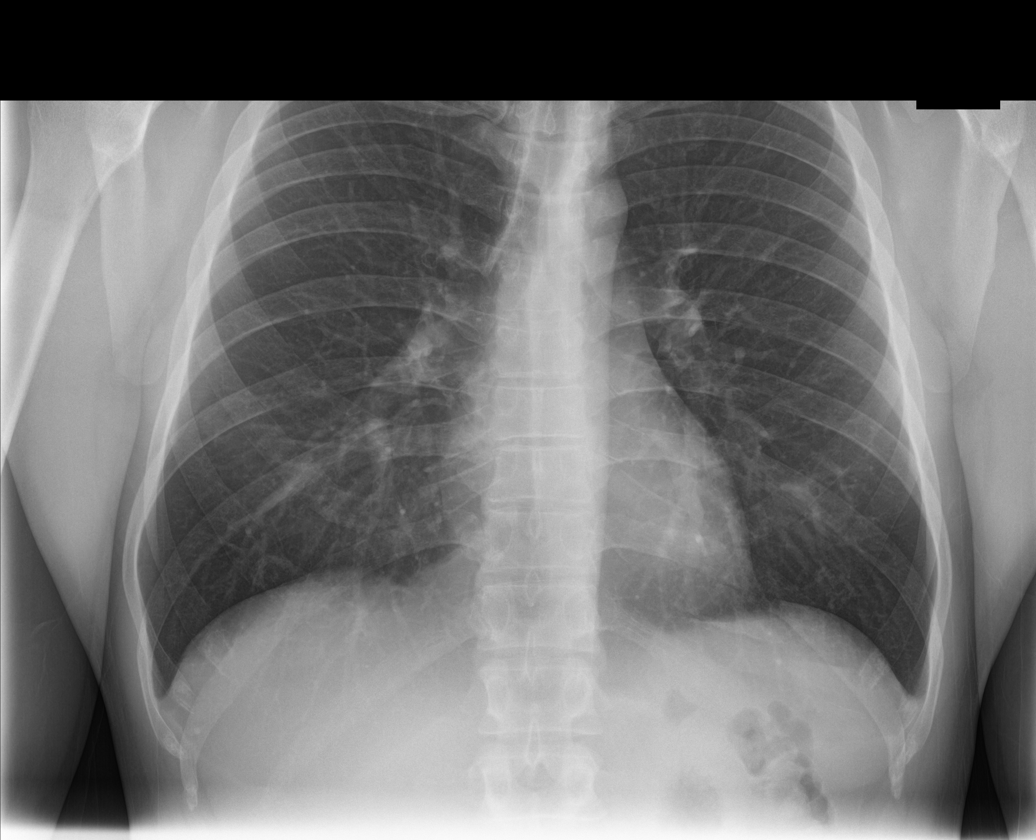

[abdomen erect (2 of 2)]
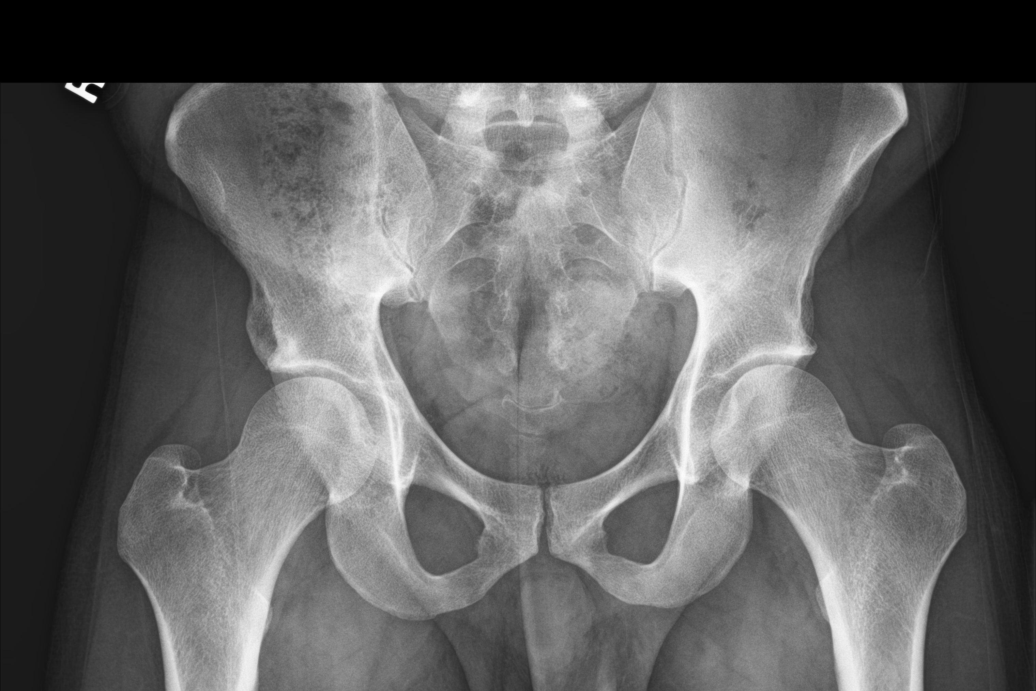

[6 of 6 positions shown; findings below may reference images not displayed]

FINDINGS: There is no evidence of dilated bowel loops or free intraperitoneal
air. No radiopaque calculi or other significant radiographic
abnormality is seen. Heart size and mediastinal contours are within
normal limits. Both lungs are clear.
IMPRESSION: Negative abdominal radiographs.  No acute cardiopulmonary disease.

## 2023-09-27 ENCOUNTER — Encounter (HOSPITAL_COMMUNITY): Payer: Self-pay

## 2023-09-27 ENCOUNTER — Other Ambulatory Visit: Payer: Self-pay

## 2023-09-27 ENCOUNTER — Emergency Department (HOSPITAL_COMMUNITY)
Admission: EM | Admit: 2023-09-27 | Discharge: 2023-09-27 | Disposition: A | Discharge status: Home / Self Care | Payer: BC Managed Care – PPO | Attending: Emergency Medicine | Admitting: Emergency Medicine

## 2023-09-27 DIAGNOSIS — R509 Fever, unspecified: Secondary | ICD-10-CM | POA: Diagnosis present

## 2023-09-27 DIAGNOSIS — Z20822 Contact with and (suspected) exposure to covid-19: Secondary | ICD-10-CM | POA: Diagnosis not present

## 2023-09-27 DIAGNOSIS — J101 Influenza due to other identified influenza virus with other respiratory manifestations: Secondary | ICD-10-CM | POA: Diagnosis not present

## 2023-09-27 LAB — RESP PANEL BY RT-PCR (RSV, FLU A&B, COVID)  RVPGX2
Influenza A by PCR: POSITIVE — AB
Influenza B by PCR: NEGATIVE
Resp Syncytial Virus by PCR: NEGATIVE
SARS Coronavirus 2 by RT PCR: NEGATIVE

## 2023-09-27 MED ORDER — ACETAMINOPHEN 500 MG PO TABS
1000.0000 mg | ORAL_TABLET | Freq: Once | ORAL | Status: AC
  Administered 2023-09-27: 1000 mg via ORAL
  Filled 2023-09-27: qty 2

## 2023-09-27 MED ORDER — PROMETHAZINE-DM 6.25-15 MG/5ML PO SYRP: 5.0000 mL | ORAL_SOLUTION | Freq: Four times a day (QID) | ORAL | 0 refills | Status: AC | PRN

## 2023-09-27 MED ORDER — ONDANSETRON HCL 4 MG PO TABS: 4.0000 mg | ORAL_TABLET | Freq: Four times a day (QID) | ORAL | 0 refills | Status: AC

## 2023-09-27 MED ORDER — ONDANSETRON 4 MG PO TBDP
4.0000 mg | ORAL_TABLET | Freq: Once | ORAL | Status: AC
  Administered 2023-09-27: 4 mg via ORAL
  Filled 2023-09-27: qty 1

## 2023-09-27 MED ORDER — NAPROXEN 500 MG PO TABS: 500.0000 mg | ORAL_TABLET | Freq: Two times a day (BID) | ORAL | 0 refills | Status: AC

## 2023-09-27 NOTE — ED Provider Notes (Signed)
[place] EMERGENCY DEPARTMENT AT [facility] Provider Note   CSN: [id number] Arrival date & time: 09/27/23  7829     History  Chief Complaint  Patient presents with   Fever    Ora Mcnatt is a 38 y.o. male.  Patient is a 38 year old male who presents to the emergency department with a chief complaint of generalized malaise and fatigue, fever, chills, cough, nausea which has been ongoing for approximate the past 24 hours.  He denies any direct known sick contacts.  He has had no associated chest pain, shortness of breath, dizziness, lightheadedness or syncope.  He denies any associated abnormal rashes.  He denies any associated pain to his neck or back but does admit to an associated headache.   Fever Associated symptoms: cough and myalgias        Home Medications Prior to Admission medications   Medication Sig Start Date End Date Taking? Authorizing Provider  omeprazole (PRILOSEC) 40 MG capsule Take 40 mg by mouth 2 (two) times daily. 06/22/16 09/18/20  [provider]      Allergies    Patient has no known allergies.    Review of Systems   Review of Systems  Constitutional:  Positive for fever.  Respiratory:  Positive for cough.   Musculoskeletal:  Positive for myalgias.  All other systems reviewed and are negative.   Physical Exam Updated Vital Signs BP 131/73 (BP Location: Right Arm)   Pulse (!) 115   Temp (!) 100.7 F (38.2 C) (Oral)   Resp 18   Ht 5\' 10"  (1.778 m)   Wt 83.9 kg   SpO2 97%   BMI 26.54 kg/m  Physical Exam Vitals reviewed.  Constitutional:      Appearance: Normal appearance.  HENT:     Head: Normocephalic and atraumatic.     Nose: Nose normal.     Mouth/Throat:     Mouth: Mucous membranes are moist.  Eyes:     Extraocular Movements: Extraocular movements intact.     Conjunctiva/sclera: Conjunctivae normal.     Pupils: Pupils are equal, round, and reactive to light.  Cardiovascular:     Rate and Rhythm: Normal  rate and regular rhythm.     Pulses: Normal pulses.     Heart sounds: Normal heart sounds.  Pulmonary:     Effort: Pulmonary effort is normal. No respiratory distress.     Breath sounds: Normal breath sounds. No stridor. No wheezing or rales.  Abdominal:     General: Abdomen is flat. Bowel sounds are normal.     Palpations: Abdomen is soft.  Musculoskeletal:        General: Normal range of motion.     Cervical back: Normal range of motion and neck supple.  Skin:    General: Skin is warm and dry.  Neurological:     General: No focal deficit present.     Mental Status: He is alert and oriented to person, place, and time. Mental status is at baseline.  Psychiatric:        Mood and Affect: Mood normal.        Behavior: Behavior normal.        Thought Content: Thought content normal.        Judgment: Judgment normal.     ED Results / Procedures / Treatments   Labs (all labs ordered are listed, but only abnormal results are displayed) Labs Reviewed  RESP PANEL BY RT-PCR (RSV, FLU A&B, COVID)  RVPGX2 - Abnormal;  Notable for the following components:      Result Value   Influenza A by PCR POSITIVE (*)    All other components within normal limits    EKG None  Radiology No results found.  Procedures Procedures    Medications Ordered in ED Medications  ondansetron (ZOFRAN-ODT) disintegrating tablet 4 mg (has no administration in time range)  acetaminophen (TYLENOL) tablet 1,000 mg (1,000 mg Oral Given 09/27/23 0935)    ED Course/ Medical Decision Making/ A&P                                 Medical Decision Making Patient is doing very well at this time and is stable for discharge home.  Discussed with patient and he is positive for influenza A.  Risk versus benefit of Tamiflu was discussed and we will forego this medication at this time.  Will plan for symptomatic treatment on an outpatient basis.  Patient has no associated hypoxia on presentation as well as no clinical  indication for dehydration.  The need for close follow-up with primary care doctor on outpatient basis was discussed as well as strict turn precautions for any new or worsening symptoms.  Patient voiced understanding had no additional questions.  Patient has no clinical indication midline etiology such as pneumonia, meningitis.  Risk OTC drugs. Prescription drug management.           Final Clinical Impression(s) / ED Diagnoses Final diagnoses:  None    Rx / DC Orders ED Discharge Orders     None         Lelon Perla, PA-C 09/27/23 1128    Derwood Kaplan, MD 09/28/23 [phone]

## 2023-09-27 NOTE — ED Triage Notes (Signed)
 Pt arrived via POV from home c/o fever at home of 103F. Pt reports last taking Tylenol  and Ibuprofen  last night.

## 2023-09-27 NOTE — Discharge Instructions (Signed)
 [facility] Primary Care Doctor List    Syliva Overman, MD. Specialty: [facility] Medicine Contact information: [address]  [place] [address]  [phone]   Lilyan Punt, MD. Specialty: [facility] Medicine Contact information: [address]  [place] [address]  [phone]   Avon Gully, MD Specialty: Internal Medicine Contact information: [address] [place] [address]  [phone]   Catalina Pizza, MD. Specialty: Internal Medicine Contact information: [address]  [place] [address]  [phone]    [facility] (Dr. Selena Batten) Specialty: Family Medicine Contact information: [address]  [place] [address]  [phone]   John Giovanni, MD. Specialty: [facility] Medicine Contact information: [address]  [address]  [place] [address]  [phone]   Carylon Perches, MD. Specialty: Internal Medicine Contact information: [address]  [address]  [place] [address]  [phone]   [facility] Family Medicine: [address] [phone]  [facility] Ace, Family medicine [address]  [phone]  [facility] [address] [place], [place] [phone]  [facility]: [address] [phone]    [facility] Ben[facility] Stab[facility]  [address] [place], [place] [address] [phone]  Services [facility] offers a variety of basic health services.  Services include but are not limited to: Blood pressure checks  Heart rate checks  Blood sugar checks  Urine analysis  Rapid strep tests  Pregnancy tests.  Health education and referrals  People needing more complex services will be directed to a physician online. Using these virtual visits, doctors can evaluate and prescribe medicine and treatments. There will be no medication on-site, though [facility] will help patients fill their prescriptions at little  to no cost.   For More information please go to: [email or web address]  Allergy and Asthma:    [address] [facility] [address] Sidney Ace [phone]  Urology:  [address]  [place] [phone]  [facility]  [address] [place], [place] [phone]  Orthopedics   [address] [place], [place] [phone]  Endocrinology  [address] [place], [place] [phone]  Podiatry: [facility] [phone]
